# Patient Record
Sex: Female | Born: 2005 | Race: Black or African American | Hispanic: No | Marital: Single | Smoking: Never smoker
Health system: Southern US, Community
[De-identification: ages and names within clinical notes are randomized; demographics above are authoritative.]

## PROBLEM LIST (undated history)

## (undated) DIAGNOSIS — R519 Headache, unspecified: Secondary | ICD-10-CM

## (undated) HISTORY — DX: Headache, unspecified: R51.9

---
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
## 2025-02-18 ENCOUNTER — Ambulatory Visit: Admitting: Family Medicine
